# Patient Record
Sex: Female | Born: 1980 | Race: White | Hispanic: No | Marital: Married | Smoking: Never smoker
Health system: Southern US, Community
[De-identification: ages and names within clinical notes are randomized; demographics above are authoritative.]

## PROBLEM LIST (undated history)

## (undated) DIAGNOSIS — K5090 Crohn's disease, unspecified, without complications: Secondary | ICD-10-CM

## (undated) HISTORY — DX: Crohn's disease, unspecified, without complications: K50.90

---

## 2001-10-04 HISTORY — PX: BREAST ENHANCEMENT SURGERY: SHX7

## 2005-02-16 ENCOUNTER — Ambulatory Visit: Payer: Self-pay

## 2005-05-05 ENCOUNTER — Ambulatory Visit: Payer: Self-pay | Admitting: Gastroenterology

## 2005-09-26 IMAGING — US ABDOMEN ULTRASOUND
1 series · 17 of 25 positions shown · non-contrast
Comparison: none

REASON FOR EXAM: LUQ pain
COMMENTS:

[Series 1: abdomen ultrasound · 17 of 53 slices shown]
[im 1/53]
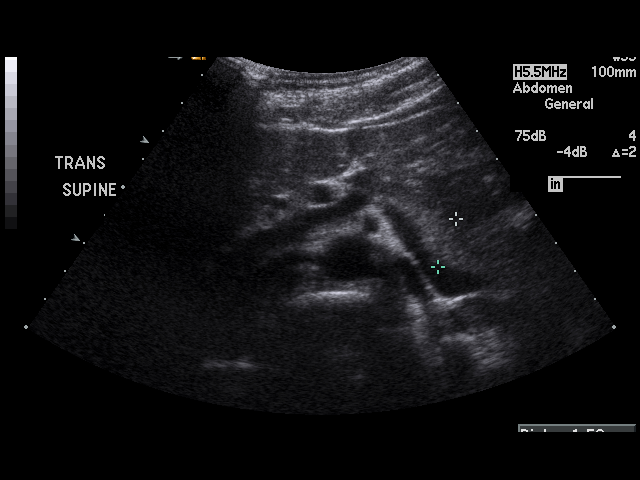
[im 5/53]
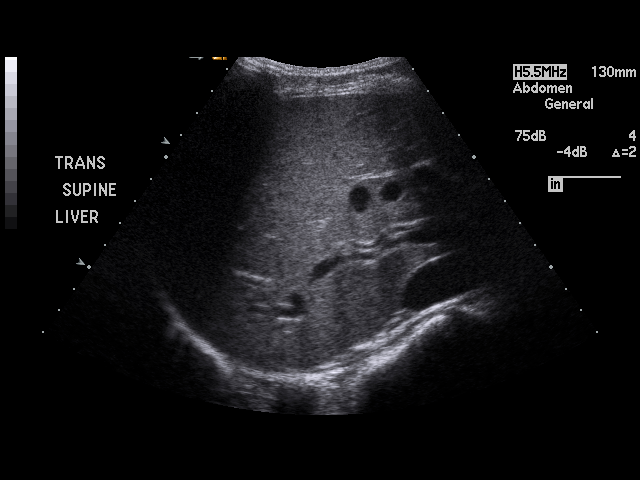
[im 7/53]
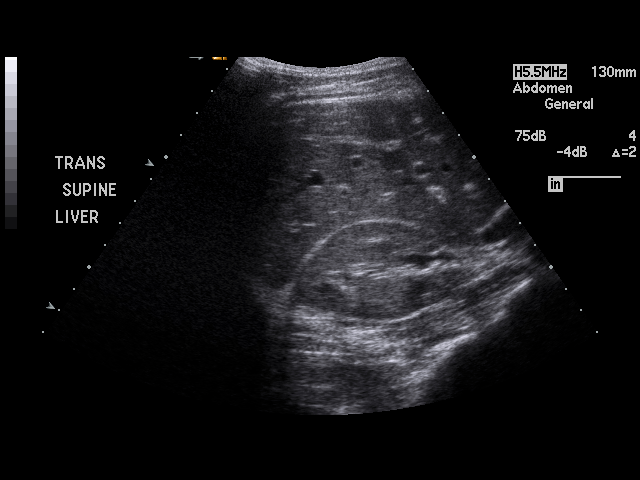
[im 11/53]
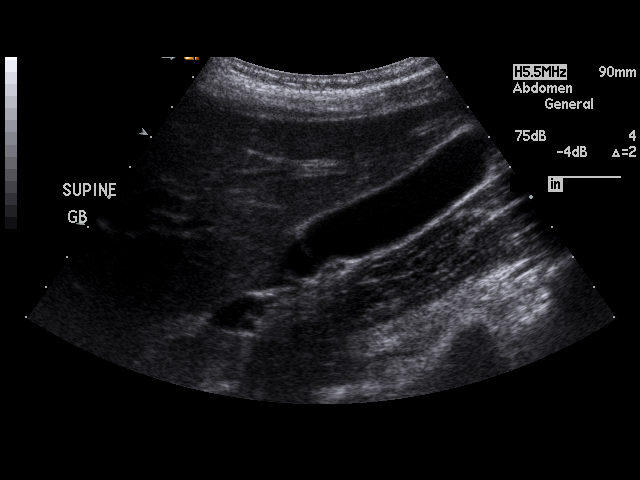
[im 14/53]
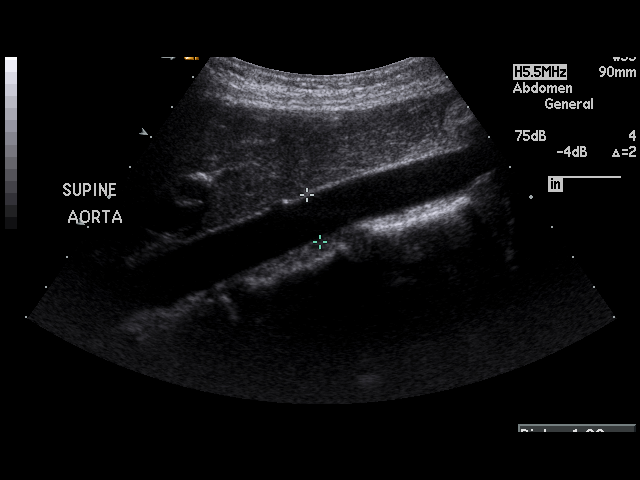
[im 18/53]
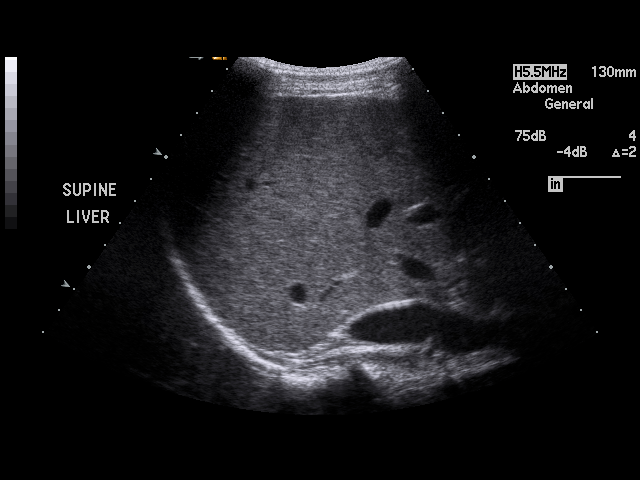
[im 20/53]
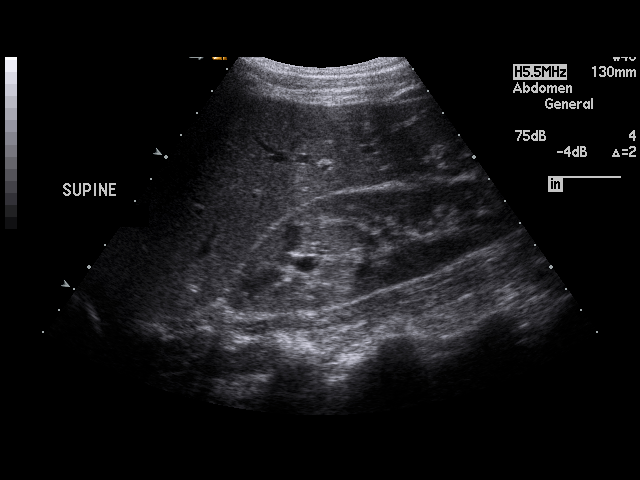
[im 24/53]
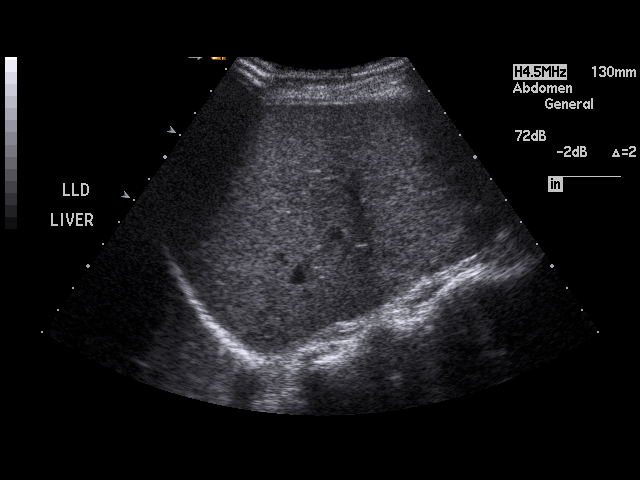
[im 27/53]
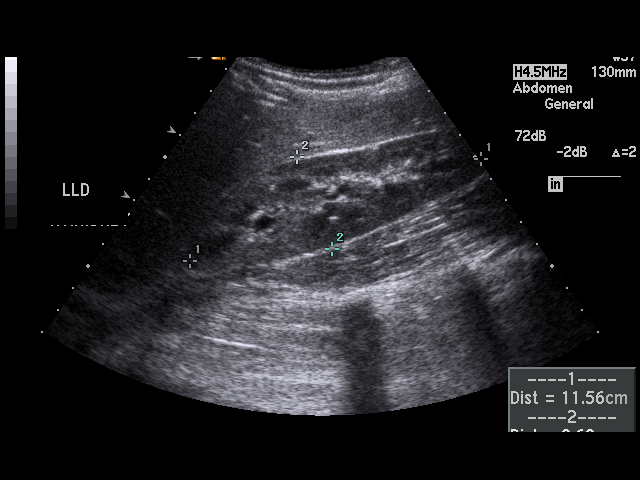
[im 29/53]
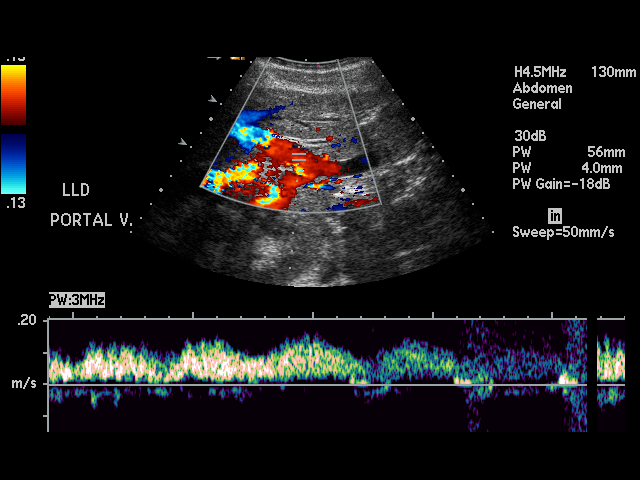
[im 33/53]
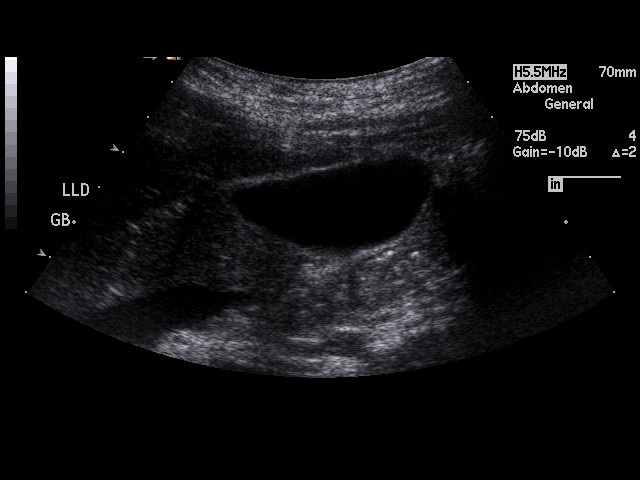
[im 35/53]
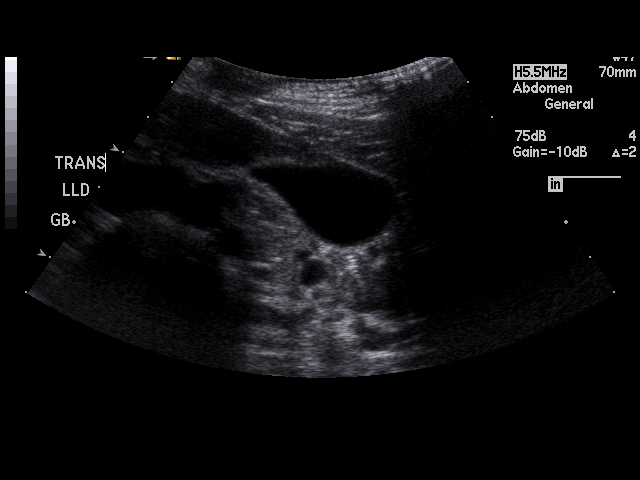
[im 40/53]
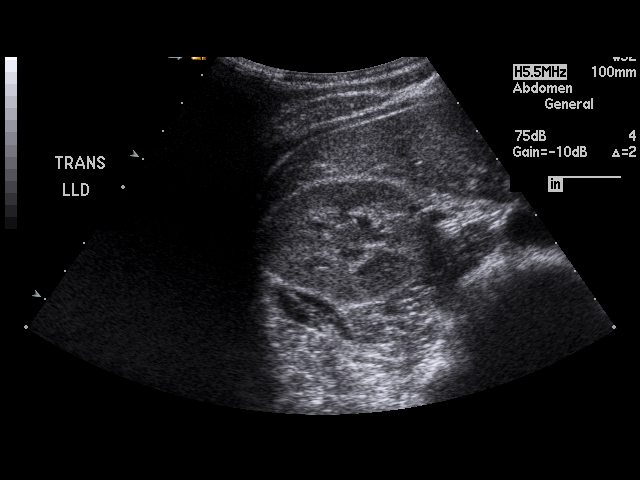
[im 42/53]
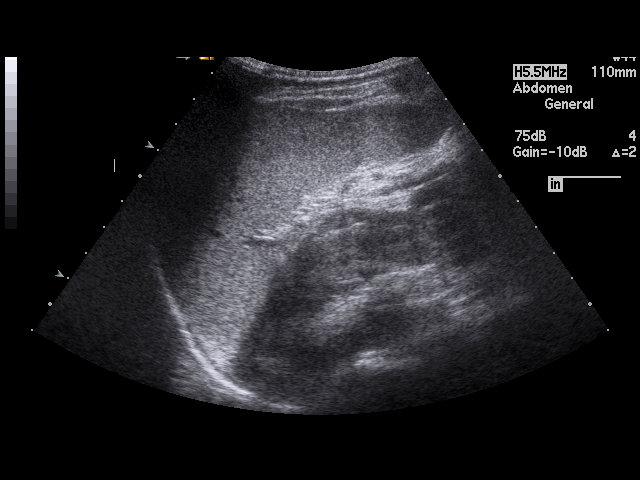
[im 46/53]
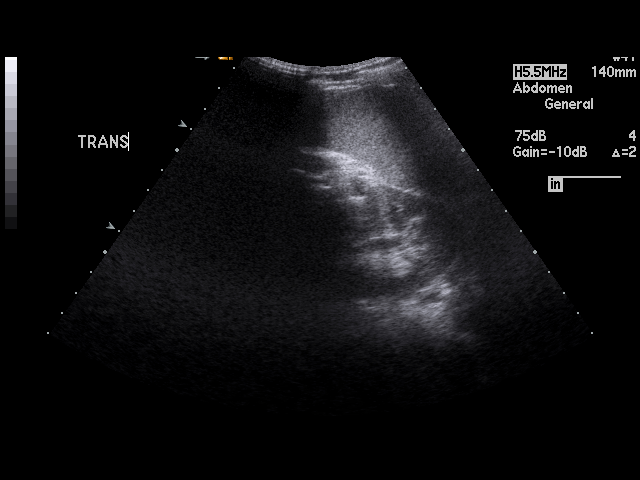
[im 48/53]
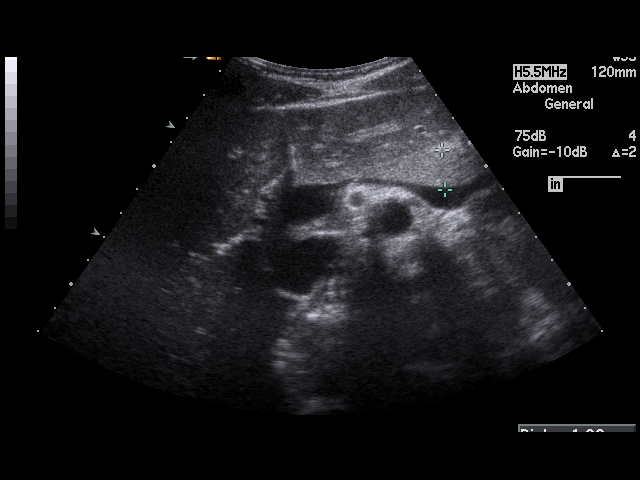
[im 53/53]
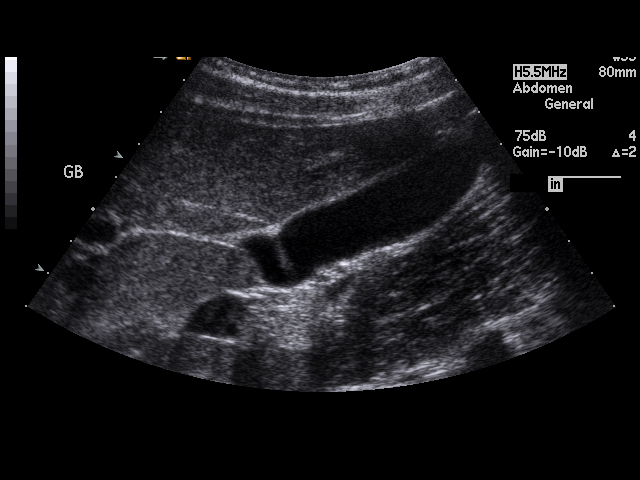

[17 of 25 positions shown; findings below may reference images not displayed]

PROCEDURE:     US  - US ABDOMEN GENERAL SURVEY  - February 16, 2005  [DATE]

RESULT:     Sonographic evaluation of the abdomen is performed.  The
pancreas, gallbladder, spleen, and kidneys appear normal.  The hepatic
echotexture appears to be normal.  No focal masses are seen.  No gallstones
or renal stones are evident.  There is no pericholecystic fluid.  The common
bile duct diameter is 2.3 mm.
IMPRESSION: Normal-appearing abdominal sonogram.

## 2007-06-22 ENCOUNTER — Inpatient Hospital Stay: Payer: Self-pay | Admitting: Certified Nurse Midwife

## 2008-03-23 ENCOUNTER — Emergency Department: Payer: Self-pay | Admitting: Emergency Medicine

## 2008-03-27 ENCOUNTER — Ambulatory Visit: Payer: Self-pay | Admitting: Gastroenterology

## 2008-10-31 IMAGING — US ABDOMEN ULTRASOUND
1 series · 17 of 25 positions shown · non-contrast
Comparison: none

REASON FOR EXAM: epigastric pain - eval gall bladder
COMMENTS:

[Series 1: abdomen ultrasound · 17 of 60 slices shown]
[im 1/60]
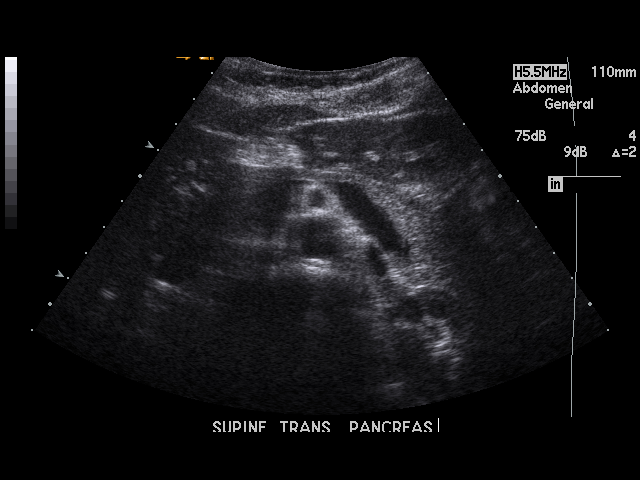
[im 5/60]
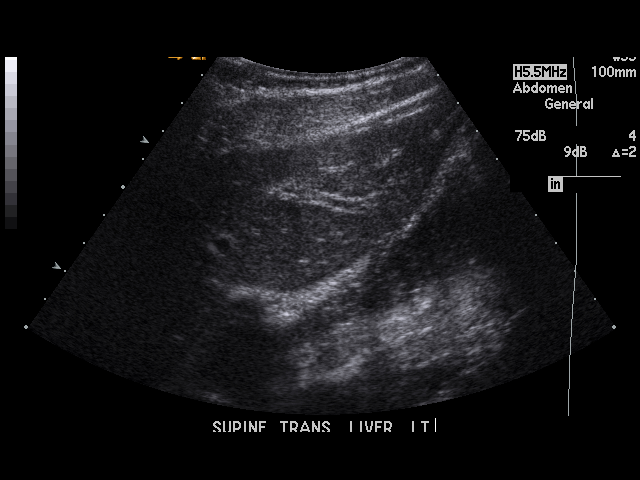
[im 8/60]
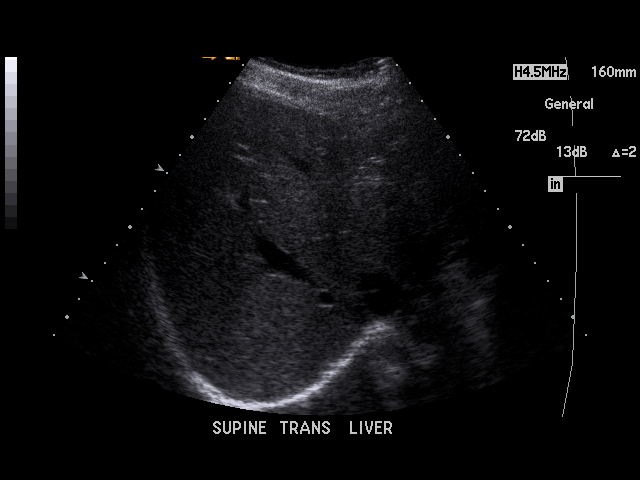
[im 13/60]
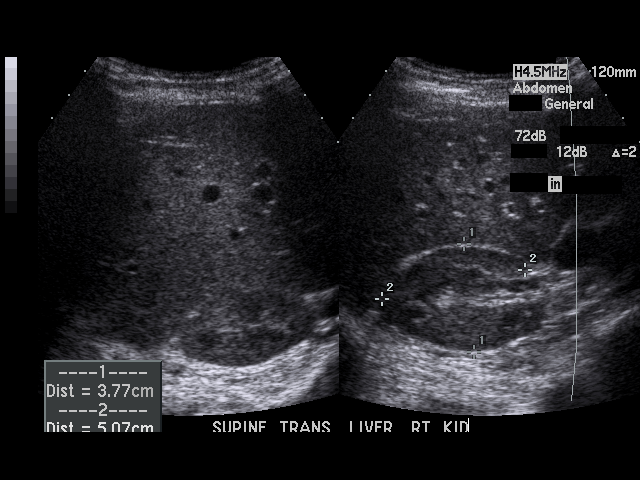
[im 15/60]
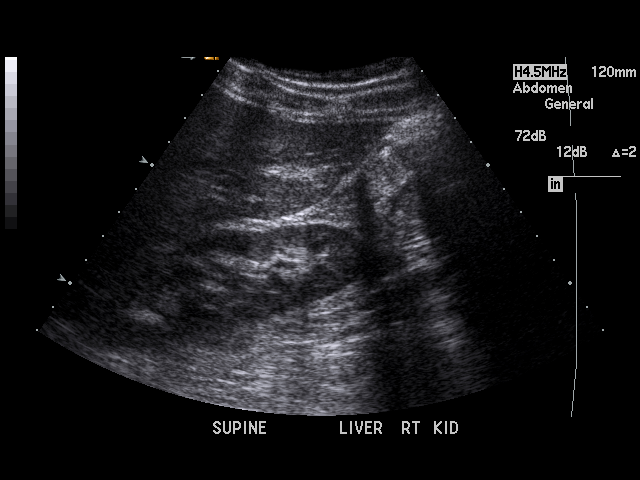
[im 20/60]
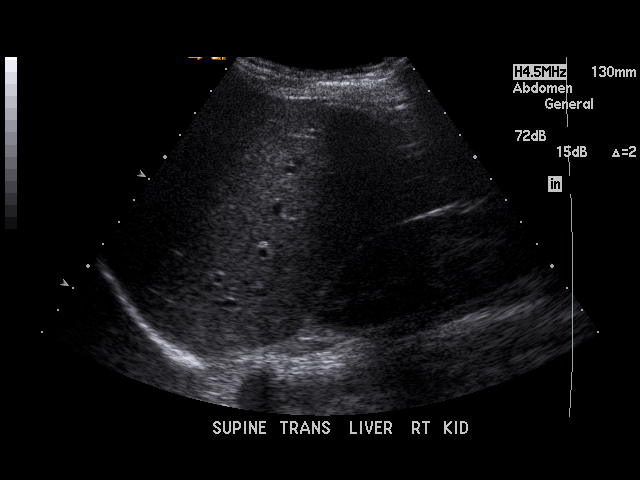
[im 23/60]
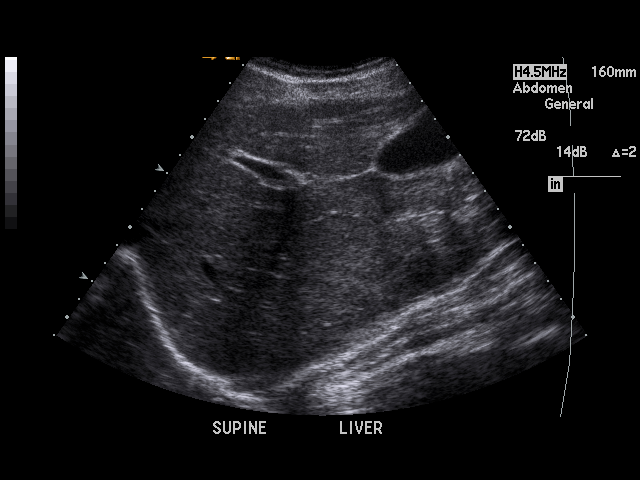
[im 28/60]
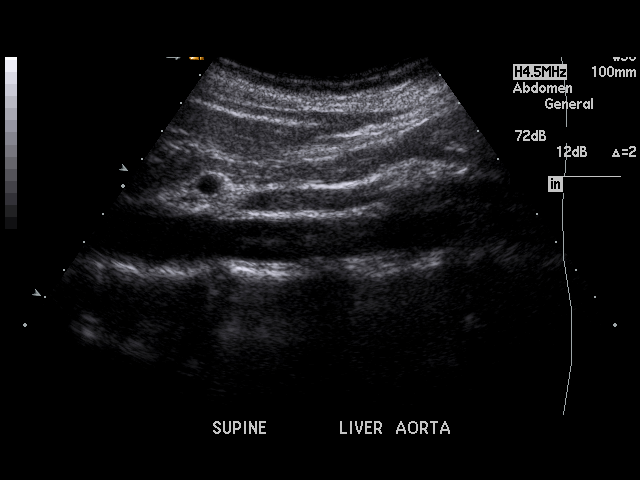
[im 30/60]
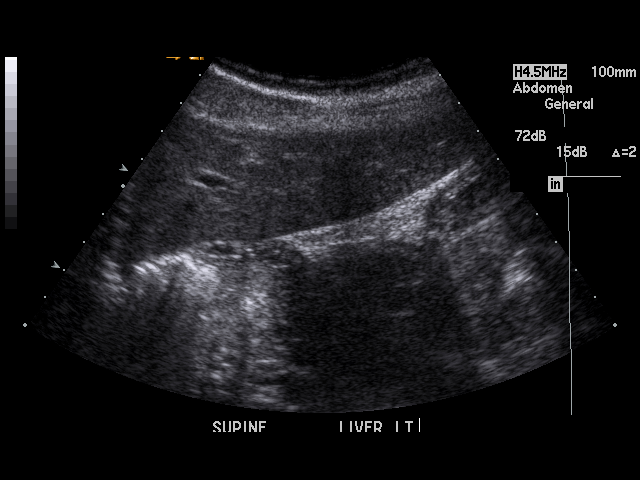
[im 32/60]
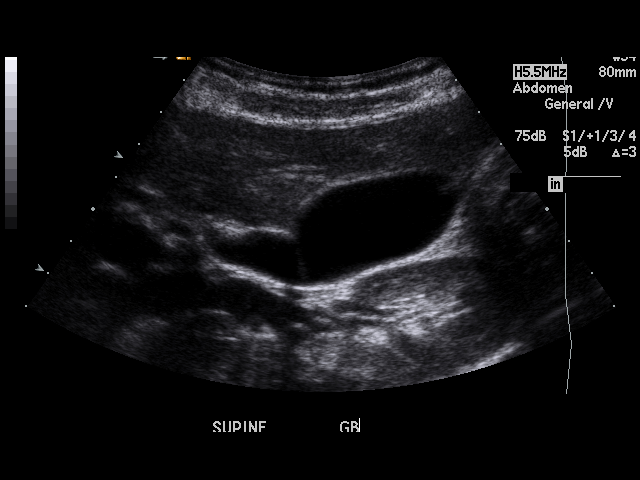
[im 37/60]
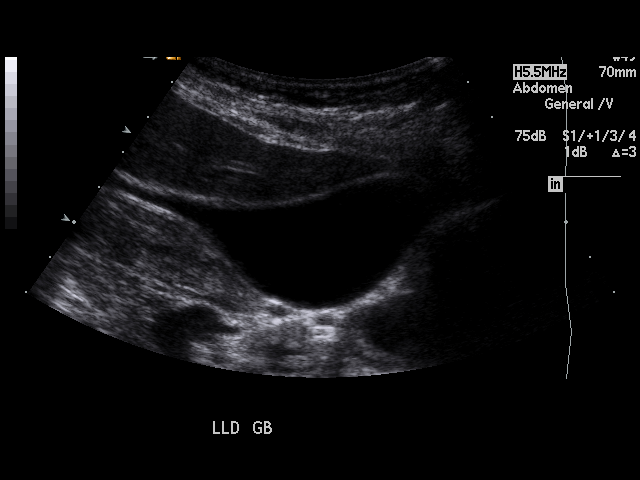
[im 40/60]
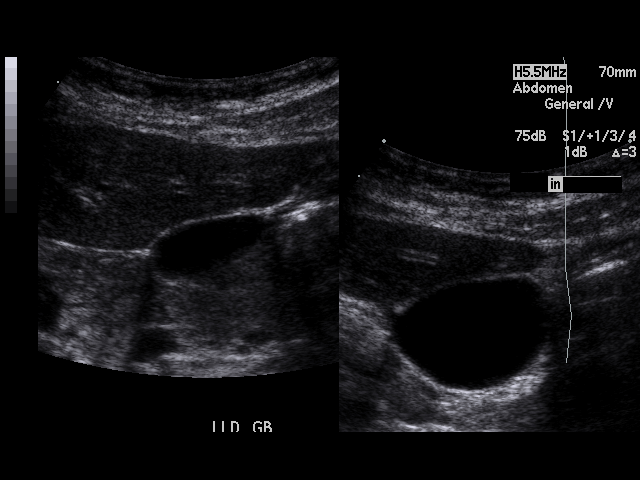
[im 45/60]
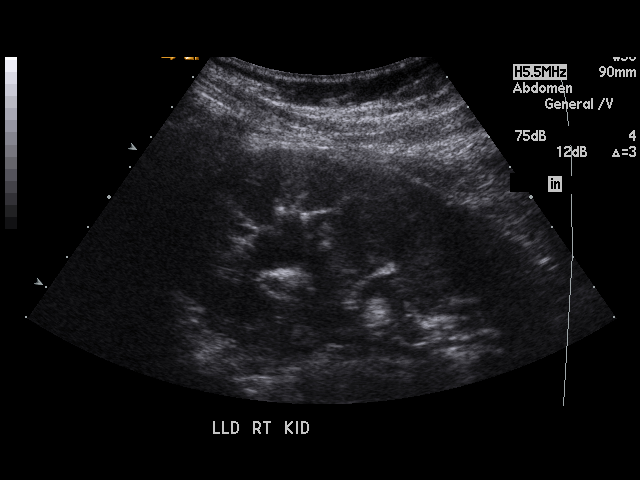
[im 47/60]
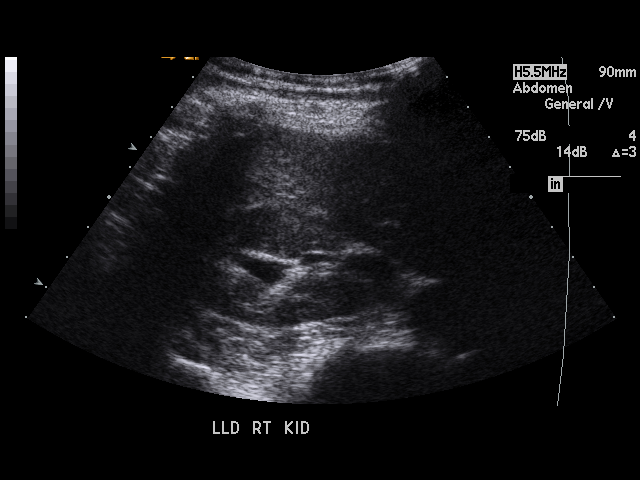
[im 52/60]
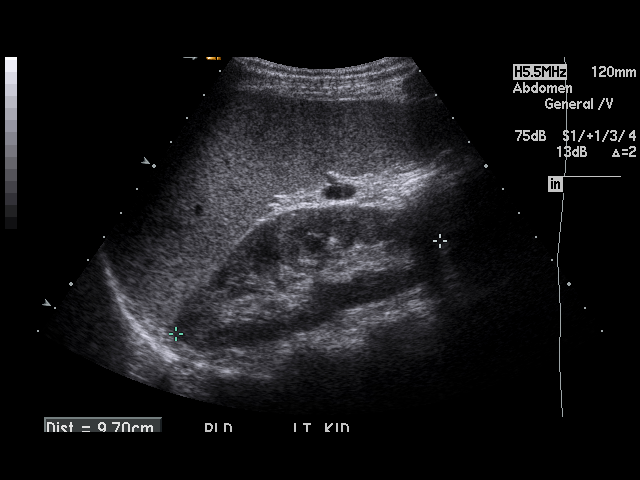
[im 55/60]
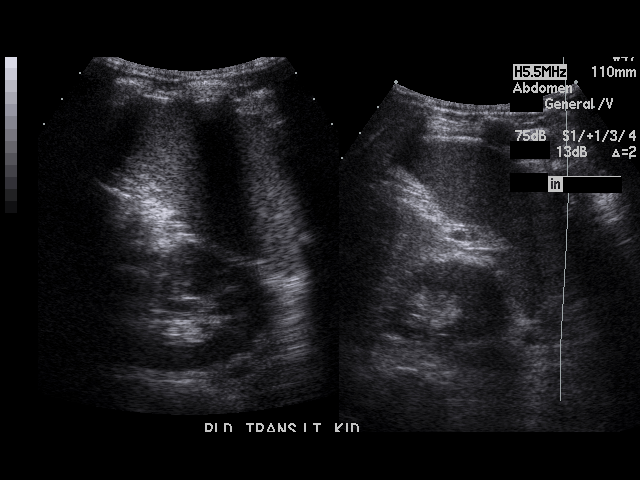
[im 60/60]
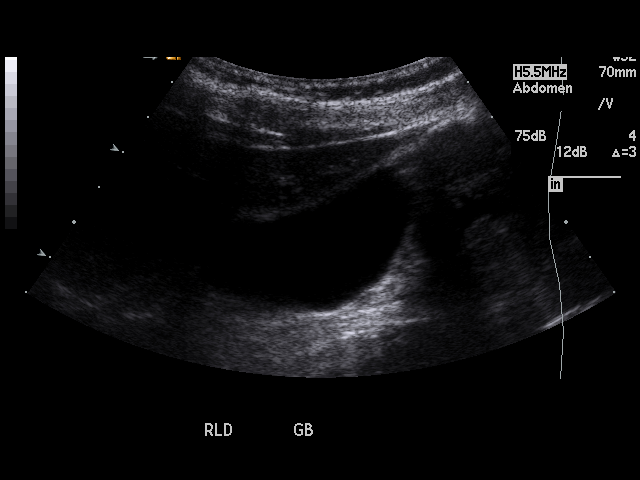

[17 of 25 positions shown; findings below may reference images not displayed]

PROCEDURE:     US  - US ABDOMEN GENERAL SURVEY  - March 23, 2008  [DATE]

RESULT:     The liver demonstrates a homogenous echotexture.  The common
bile duct measures 2.9 mm in diameter. Hepatopetal flow is demonstrated
within the portal vein. The IVC and aorta are unremarkable.  Evaluation of
the gallbladder demonstrates no evidence of pericholecystic fluid,
gallstones, sludging or gallbladder wall thickening.  The gallbladder wall
measures 1.2 mm in thickness.  The common bile duct measures 2.9 mm in
diameter.  Evaluation of the kidneys demonstrates no evidence of
hydronephrosis.  Small echogenic foci project within the RIGHT kidney which
may represent small nonobstructive calculi.  The spleen and pancreas are
unremarkable.
IMPRESSION: 1.     No sonographic evidence of acute cholecystitis.
2.     Dr. Wirti of the Emergency Department was informed of these
findings via preliminary fax report on 03/23/08 at [DATE] a.m. CST.

## 2009-08-23 ENCOUNTER — Ambulatory Visit: Payer: Self-pay | Admitting: Gastroenterology

## 2015-02-22 ENCOUNTER — Other Ambulatory Visit: Payer: Self-pay | Admitting: Obstetrics and Gynecology

## 2015-02-22 ENCOUNTER — Other Ambulatory Visit (HOSPITAL_COMMUNITY)
Admission: RE | Admit: 2015-02-22 | Discharge: 2015-02-22 | Disposition: A | Discharge status: Home / Self Care | Payer: Federal, State, Local not specified - PPO | Source: Ambulatory Visit | Attending: Obstetrics and Gynecology | Admitting: Obstetrics and Gynecology

## 2015-02-22 DIAGNOSIS — Z01411 Encounter for gynecological examination (general) (routine) with abnormal findings: Secondary | ICD-10-CM | POA: Diagnosis present

## 2015-02-22 DIAGNOSIS — Z1151 Encounter for screening for human papillomavirus (HPV): Secondary | ICD-10-CM | POA: Diagnosis present

## 2015-02-24 LAB — CYTOLOGY - PAP

## 2015-08-31 LAB — OB RESULTS CONSOLE ABO/RH: RH Type: NEGATIVE

## 2015-08-31 LAB — OB RESULTS CONSOLE RUBELLA ANTIBODY, IGM: Rubella: IMMUNE

## 2015-08-31 LAB — OB RESULTS CONSOLE RPR: RPR: NONREACTIVE

## 2015-08-31 LAB — OB RESULTS CONSOLE HEPATITIS B SURFACE ANTIGEN: Hepatitis B Surface Ag: NEGATIVE

## 2015-10-03 NOTE — L&D Delivery Note (Signed)
Delivery Note At 1:36 PM a viable female was delivered by SVD (Presentation: Right Occiput Anterior).  APGAR: 9, 9; weight 8 lb 2 oz (2339 g).   Placenta status: Intact, Spontaneous.  Cord: 3 vessels with the following complications: None.  Cord pH: n/a Cord blood donated for banking.  Collected by technician.  Anesthesia: Epidural  Episiotomy: None Lacerations: Right labial laceration, 2nd degree laceration Suture Repair: 2.0,3.0 Chromic Est. Blood Loss (mL): 250  Mom to postpartum.  Baby to Couplet care / Skin to Skin     Hala Narula 04/25/2016, 2:14 PM

## 2015-11-19 ENCOUNTER — Telehealth: Payer: Self-pay | Admitting: Internal Medicine

## 2015-11-19 NOTE — Telephone Encounter (Signed)
Received records from [facility] for appointment on 12/03/15 with Dr Rennis Golden.  Records given to [facility] (medical records) for Dr Blanchie Dessert schedule on 12/03/15. lp

## 2015-12-03 ENCOUNTER — Encounter: Payer: Self-pay | Admitting: Internal Medicine

## 2015-12-03 ENCOUNTER — Ambulatory Visit (INDEPENDENT_AMBULATORY_CARE_PROVIDER_SITE_OTHER): Payer: Federal, State, Local not specified - PPO | Admitting: Internal Medicine

## 2015-12-03 VITALS — BP 104/60 | HR 78 | Ht 67.0 in | Wt 148.1 lb

## 2015-12-03 DIAGNOSIS — I4510 Unspecified right bundle-branch block: Secondary | ICD-10-CM

## 2015-12-03 DIAGNOSIS — Z3490 Encounter for supervision of normal pregnancy, unspecified, unspecified trimester: Secondary | ICD-10-CM

## 2015-12-03 NOTE — Patient Instructions (Signed)
Your physician recommends that you schedule a follow-up appointment as needed  

## 2015-12-05 ENCOUNTER — Encounter: Payer: Self-pay | Admitting: Internal Medicine

## 2015-12-05 DIAGNOSIS — I4510 Unspecified right bundle-branch block: Secondary | ICD-10-CM | POA: Insufficient documentation

## 2015-12-05 DIAGNOSIS — Z3490 Encounter for supervision of normal pregnancy, unspecified, unspecified trimester: Secondary | ICD-10-CM | POA: Insufficient documentation

## 2015-12-05 NOTE — Progress Notes (Signed)
OFFICE NOTE  Chief Complaint:  Abnormal EKG  Primary Care Physician: No primary care provider on file.  HPI:  Amber Woodard is a pleasant 35 year old female who is currently pregnant with her second child. Her first child is now 35 years old. She recently had an EKG which demonstrated a right bundle branch block. She was referred for evaluation of that abnormality. We repeated an EKG today in the office which does not demonstrate a right bundle branch block. This suggests it may be rate dependent or intermittent. Amber Woodard has few if any cardiac risk factors. She is generally healthy except she does have a history of Crohn's disease. This sounds like it's been well controlled she does not have diabetes or dyslipidemia. She denies any recent shortness of breath worsening chest pain, syncope or other associated symptoms.  PMHx:  Past Medical History  Diagnosis Date  . Crohn disease [facility])     Past Surgical History  Procedure Laterality Date  . Breast enhancement surgery      FAMHx:  Family History  Problem Relation Age of Onset  . Alzheimer's disease Maternal Grandfather   . Alzheimer's disease Paternal Grandfather     SOCHx:   reports that she has never smoked. She does not have any smokeless tobacco history on file. She reports that she does not drink alcohol or use illicit drugs.  ALLERGIES:  Allergies  Allergen Reactions  . Codeine Nausea And Vomiting    ROS: Pertinent items noted in HPI and remainder of comprehensive ROS otherwise negative.  HOME MEDS: Current Outpatient Prescriptions  Medication Sig Dispense Refill  . Prenatal Vit-Fe Fumarate-FA (PRENATAL VITAMIN PO) Take 2 Doses by mouth daily.     No current facility-administered medications for this visit.    LABS/IMAGING: No results found for this or any previous visit (from the past 48 hour(s)). No results found.  WEIGHTS: Wt Readings from Last 3 Encounters:  12/03/15 148 lb 1.6 oz (67.178 kg)      VITALS: BP 104/60 mmHg  Pulse 78  Ht 5\' 7"  (1.702 m)  Wt 148 lb 1.6 oz (67.178 kg)  BMI 23.19 kg/m2  EXAM: General appearance: alert and no distress Neck: no carotid bruit and no JVD Lungs: clear to auscultation bilaterally Heart: regular rate and rhythm, S1, S2 normal, no murmur, click, rub or gallop Abdomen: soft, non-tender; bowel sounds normal; no masses,  no organomegaly Extremities: extremities normal, atraumatic, no cyanosis or edema Pulses: 2+ and symmetric Skin: Skin color, texture, turgor normal. No rashes or lesions Neurologic: Grossly normal Psych: Pleasant  EKG: Normal sinus rhythm at 76  ASSESSMENT: 1. Intermittent right bundle branch block 2. Pregnancy with history of uncomplicated pregnancy in the past 3. Crohn's colitis  PLAN: 1.   Mrs. Parsell had intermittent right bundle branch block. Unfortunately I'm not able to review the EKG from her primary care provider's office directly. Are EKG today however does not show right bundle branch block. This could suggest that the block could be rate dependent or based on other conditions. This is even less likely to be of any concern. She is completely asymptomatic. She's had a previous pregnancy without any complications. I do not for see the need for any additional testing and would not categorize any overt EKG changes at putting her at any increased risk for her pregnancy. She does have Crohn's disease and there may be an increased risk for coronary artery disease related to this disorder. She should continue active lifestyle, risk  factor modification and treatment to suppress her Crohn's disease as she has in the past.  Thanks for the kind referral. Follow-up as needed.  Kenneth C. Hilty, [facility] Attending Cardiologist [facility]  Chrystie Nose 12/05/2015, 11:33 AM

## 2015-12-10 ENCOUNTER — Encounter: Payer: Self-pay | Admitting: *Deleted

## 2016-01-27 LAB — OB RESULTS CONSOLE ANTIBODY SCREEN: Antibody Screen: NEGATIVE

## 2016-01-27 LAB — OB RESULTS CONSOLE HIV ANTIBODY (ROUTINE TESTING): HIV: NONREACTIVE

## 2016-03-23 LAB — OB RESULTS CONSOLE GBS: GBS: NEGATIVE

## 2016-04-01 ENCOUNTER — Inpatient Hospital Stay (HOSPITAL_COMMUNITY)
Admission: AD | Admit: 2016-04-01 | Payer: Federal, State, Local not specified - PPO | Source: Ambulatory Visit | Admitting: Obstetrics and Gynecology

## 2016-04-19 ENCOUNTER — Telehealth (HOSPITAL_COMMUNITY): Payer: Self-pay | Admitting: *Deleted

## 2016-04-19 NOTE — Telephone Encounter (Signed)
Preadmission screen  

## 2016-04-20 ENCOUNTER — Encounter (HOSPITAL_COMMUNITY): Payer: Self-pay | Admitting: *Deleted

## 2016-04-20 ENCOUNTER — Telehealth (HOSPITAL_COMMUNITY): Payer: Self-pay | Admitting: *Deleted

## 2016-04-20 NOTE — Telephone Encounter (Signed)
Preadmission screen  

## 2016-04-25 NOTE — H&P (Signed)
Amber Woodard is a 35 y.o. female G2 P1001 @ 40 6/7 weeks presenting for IOL due to postdates.  Pt's pregnancy has been complicated by AMA and personal h/o Crohn's disease.  Panoroma and AFP were low risk, Crohn's Disease has been well controlled. Pt denies regular contractions.  Some pink spotting, denies LOF.  Active fetus. PNC with [facility]) since 10 weeks.  OB History    Gravida Para Term Preterm AB Living   SAB TAB Ectopic Multiple Live Births           1     Past Medical History:  Diagnosis Date  . Crohn disease [facility])    Past Surgical History:  Procedure Laterality Date  . BREAST ENHANCEMENT SURGERY  10/04/2001   Family History: family history includes Alzheimer's disease in her maternal grandfather and paternal grandfather. Social History:  reports that she has never smoked. She has never used smokeless tobacco. She reports that she does not drink alcohol or use drugs.     Maternal Diabetes: No Genetic Screening: Normal Maternal Ultrasounds/Referrals: Normal Fetal Ultrasounds or other Referrals:  None Maternal Substance Abuse:  No Significant Maternal Medications:  Meds include: Other: Pentasa stopped in October Significant Maternal Lab Results:  Lab values include: Group B Strep negative, Rh negative Other Comments:  None  Review of Systems  Constitutional: Negative.   Gastrointestinal: Negative for abdominal pain.   Maternal Medical History:  Contractions: Frequency: irregular.   Perceived severity is mild.    Fetal activity: Perceived fetal activity is normal.    Prenatal complications: no prenatal complications Prenatal Complications - Diabetes: none.      Last menstrual period 07/14/2015. Maternal Exam:  Abdomen: Patient reports no abdominal tenderness. Fundal height is 40 cm.   Estimated fetal weight is 7 1/2.   Fetal presentation: vertex  Introitus: Normal vulva. Ferning test: not done.   Pelvis: adequate for delivery.    Cervix: Cervix evaluated by digital exam.   1/50/-3  Fetal Exam Fetal Monitor Review: Mode: fetoscope and hand-held doppler probe.   Baseline rate: 150s.      Physical Exam  Constitutional: She is oriented to person, place, and time. She appears well-developed and well-nourished.  HENT:  Head: Normocephalic and atraumatic.  Neck: Normal range of motion.  Respiratory: Effort normal. No respiratory distress.  GI: There is no tenderness.  Musculoskeletal: She exhibits edema. She exhibits no tenderness.  Neurological: She is alert and oriented to person, place, and time.  Skin: Skin is warm and dry.  Psychiatric: She has a normal mood and affect.    Prenatal labs: ABO, Rh: A/Negative/-- (11/29 0000) Antibody: Negative (04/27 0000) Rubella: Immune (11/29 0000) RPR: Nonreactive (11/29 0000)  HBsAg: Negative (11/29 0000)  HIV: Non-reactive (04/27 0000)  GBS: Positive (06/22 0000)   Assessment/Plan: IUP @ 40 6/7 weeks AMA Rh negative H/o Crohn's disease-stable GBS negative (not positive as above)  Admit for IOL with Cytotec then Pitocin. Epidural, IV Fentanyl or Nitrous prn.  Pt counseled on process and risk.  Geryl Rankins 04/25/2016, 5:40 PM

## 2016-04-26 ENCOUNTER — Inpatient Hospital Stay (HOSPITAL_COMMUNITY)
Admission: RE | Admit: 2016-04-26 | Discharge: 2016-04-27 | DRG: 775 | Disposition: A | Discharge status: Home / Self Care | Payer: Federal, State, Local not specified - PPO | Source: Ambulatory Visit | Attending: Obstetrics and Gynecology | Admitting: Obstetrics and Gynecology

## 2016-04-26 ENCOUNTER — Inpatient Hospital Stay (HOSPITAL_COMMUNITY): Payer: Federal, State, Local not specified - PPO | Admitting: Anesthesiology

## 2016-04-26 ENCOUNTER — Encounter (HOSPITAL_COMMUNITY): Payer: Self-pay

## 2016-04-26 DIAGNOSIS — O26893 Other specified pregnancy related conditions, third trimester: Secondary | ICD-10-CM | POA: Diagnosis present

## 2016-04-26 DIAGNOSIS — Z820 Family history of epilepsy and other diseases of the nervous system: Secondary | ICD-10-CM

## 2016-04-26 DIAGNOSIS — Z6791 Unspecified blood type, Rh negative: Secondary | ICD-10-CM | POA: Diagnosis not present

## 2016-04-26 DIAGNOSIS — O480 Post-term pregnancy: Secondary | ICD-10-CM | POA: Diagnosis present

## 2016-04-26 DIAGNOSIS — O99824 Streptococcus B carrier state complicating childbirth: Secondary | ICD-10-CM | POA: Diagnosis present

## 2016-04-26 DIAGNOSIS — K5090 Crohn's disease, unspecified, without complications: Secondary | ICD-10-CM | POA: Diagnosis present

## 2016-04-26 DIAGNOSIS — O9962 Diseases of the digestive system complicating childbirth: Secondary | ICD-10-CM | POA: Diagnosis present

## 2016-04-26 DIAGNOSIS — Z3A41 41 weeks gestation of pregnancy: Secondary | ICD-10-CM | POA: Diagnosis not present

## 2016-04-26 LAB — CBC
HCT: 31.5 % — ABNORMAL LOW (ref 36.0–46.0)
Hemoglobin: 10.2 g/dL — ABNORMAL LOW (ref 12.0–15.0)
MCH: 24.3 pg — AB (ref 26.0–34.0)
MCHC: 32.4 g/dL (ref 30.0–36.0)
MCV: 75 fL — ABNORMAL LOW (ref 78.0–100.0)
PLATELETS: 215 10*3/uL (ref 150–400)
RBC: 4.2 MIL/uL (ref 3.87–5.11)
RDW: 14.1 % (ref 11.5–15.5)
WBC: 11.5 10*3/uL — ABNORMAL HIGH (ref 4.0–10.5)

## 2016-04-26 LAB — ABO/RH: ABO/RH(D): A NEG

## 2016-04-26 LAB — TYPE AND SCREEN
ABO/RH(D): A NEG
Antibody Screen: NEGATIVE

## 2016-04-26 LAB — RPR: RPR: NONREACTIVE

## 2016-04-26 MED ORDER — FENTANYL 2.5 MCG/ML BUPIVACAINE 1/10 % EPIDURAL INFUSION (WH - ANES)
14.0000 mL/h | INTRAMUSCULAR | Status: DC | PRN
  Administered 2016-04-26: 14 mL/h via EPIDURAL
  Filled 2016-04-26: qty 125

## 2016-04-26 MED ORDER — LACTATED RINGERS IV SOLN
INTRAVENOUS | Status: DC
  Administered 2016-04-26: 02:00:00 via INTRAVENOUS

## 2016-04-26 MED ORDER — OXYTOCIN 40 UNITS IN LACTATED RINGERS INFUSION - SIMPLE MED
2.5000 [IU]/h | INTRAVENOUS | Status: DC
  Administered 2016-04-26: 2.5 [IU]/h via INTRAVENOUS

## 2016-04-26 MED ORDER — PHENYLEPHRINE 40 MCG/ML (10ML) SYRINGE FOR IV PUSH (FOR BLOOD PRESSURE SUPPORT)
80.0000 ug | PREFILLED_SYRINGE | INTRAVENOUS | Status: DC | PRN
  Filled 2016-04-26: qty 5

## 2016-04-26 MED ORDER — COCONUT OIL OIL: 1.0000 "application " | TOPICAL_OIL | Status: DC | PRN

## 2016-04-26 MED ORDER — METHYLERGONOVINE MALEATE 0.2 MG PO TABS: 0.2000 mg | ORAL_TABLET | ORAL | Status: DC | PRN

## 2016-04-26 MED ORDER — DIPHENHYDRAMINE HCL 50 MG/ML IJ SOLN: 12.5000 mg | INTRAMUSCULAR | Status: DC | PRN

## 2016-04-26 MED ORDER — LIDOCAINE HCL (PF) 1 % IJ SOLN
30.0000 mL | INTRAMUSCULAR | Status: DC | PRN
  Filled 2016-04-26: qty 30

## 2016-04-26 MED ORDER — ONDANSETRON HCL 4 MG/2ML IJ SOLN: 4.0000 mg | Freq: Four times a day (QID) | INTRAMUSCULAR | Status: DC | PRN

## 2016-04-26 MED ORDER — DIPHENHYDRAMINE HCL 25 MG PO CAPS: 25.0000 mg | ORAL_CAPSULE | Freq: Four times a day (QID) | ORAL | Status: DC | PRN

## 2016-04-26 MED ORDER — IBUPROFEN 600 MG PO TABS
600.0000 mg | ORAL_TABLET | Freq: Four times a day (QID) | ORAL | Status: DC
  Administered 2016-04-26 – 2016-04-27 (×4): 600 mg via ORAL
  Filled 2016-04-26 (×4): qty 1

## 2016-04-26 MED ORDER — OXYCODONE-ACETAMINOPHEN 5-325 MG PO TABS: 2.0000 | ORAL_TABLET | ORAL | Status: DC | PRN

## 2016-04-26 MED ORDER — TETANUS-DIPHTH-ACELL PERTUSSIS 5-2.5-18.5 LF-MCG/0.5 IM SUSP: 0.5000 mL | Freq: Once | INTRAMUSCULAR | Status: DC

## 2016-04-26 MED ORDER — ONDANSETRON HCL 4 MG/2ML IJ SOLN
4.0000 mg | INTRAMUSCULAR | Status: DC | PRN
Start: 2016-04-26 — End: 2016-04-27

## 2016-04-26 MED ORDER — OXYCODONE-ACETAMINOPHEN 5-325 MG PO TABS: 1.0000 | ORAL_TABLET | ORAL | Status: DC | PRN

## 2016-04-26 MED ORDER — METHYLERGONOVINE MALEATE 0.2 MG/ML IJ SOLN: 0.2000 mg | INTRAMUSCULAR | Status: DC | PRN

## 2016-04-26 MED ORDER — FENTANYL CITRATE (PF) 100 MCG/2ML IJ SOLN: 50.0000 ug | INTRAMUSCULAR | Status: DC | PRN

## 2016-04-26 MED ORDER — TERBUTALINE SULFATE 1 MG/ML IJ SOLN
0.2500 mg | Freq: Once | INTRAMUSCULAR | Status: DC | PRN
  Filled 2016-04-26: qty 1

## 2016-04-26 MED ORDER — LIDOCAINE HCL (PF) 1 % IJ SOLN
INTRAMUSCULAR | Status: DC | PRN
  Administered 2016-04-26: 2 mL via EPIDURAL
  Administered 2016-04-26 (×2): 4 mL via EPIDURAL

## 2016-04-26 MED ORDER — PRENATAL MULTIVITAMIN CH
1.0000 | ORAL_TABLET | Freq: Every day | ORAL | Status: DC
  Filled 2016-04-26: qty 1

## 2016-04-26 MED ORDER — MAGNESIUM HYDROXIDE 400 MG/5ML PO SUSP: 30.0000 mL | ORAL | Status: DC | PRN

## 2016-04-26 MED ORDER — EPHEDRINE 5 MG/ML INJ
10.0000 mg | INTRAVENOUS | Status: DC | PRN
  Filled 2016-04-26: qty 4

## 2016-04-26 MED ORDER — ZOLPIDEM TARTRATE 5 MG PO TABS: 5.0000 mg | ORAL_TABLET | Freq: Every evening | ORAL | Status: DC | PRN

## 2016-04-26 MED ORDER — PHENYLEPHRINE 40 MCG/ML (10ML) SYRINGE FOR IV PUSH (FOR BLOOD PRESSURE SUPPORT)
80.0000 ug | PREFILLED_SYRINGE | INTRAVENOUS | Status: DC | PRN
  Filled 2016-04-26: qty 5
  Filled 2016-04-26: qty 10

## 2016-04-26 MED ORDER — SENNOSIDES-DOCUSATE SODIUM 8.6-50 MG PO TABS
2.0000 | ORAL_TABLET | ORAL | Status: DC
  Administered 2016-04-27: 2 via ORAL
  Filled 2016-04-26: qty 2

## 2016-04-26 MED ORDER — LACTATED RINGERS IV SOLN
500.0000 mL | Freq: Once | INTRAVENOUS | Status: AC
  Administered 2016-04-26: 500 mL via INTRAVENOUS

## 2016-04-26 MED ORDER — LACTATED RINGERS IV SOLN: 500.0000 mL | INTRAVENOUS | Status: DC | PRN

## 2016-04-26 MED ORDER — OXYTOCIN 40 UNITS IN LACTATED RINGERS INFUSION - SIMPLE MED: 2.5000 [IU]/h | INTRAVENOUS | Status: DC | PRN

## 2016-04-26 MED ORDER — SIMETHICONE 80 MG PO CHEW: 80.0000 mg | CHEWABLE_TABLET | ORAL | Status: DC | PRN

## 2016-04-26 MED ORDER — BENZOCAINE-MENTHOL 20-0.5 % EX AERO
1.0000 "application " | INHALATION_SPRAY | CUTANEOUS | Status: DC | PRN
  Administered 2016-04-26: 1 via TOPICAL
  Filled 2016-04-26: qty 56

## 2016-04-26 MED ORDER — WITCH HAZEL-GLYCERIN EX PADS: 1.0000 "application " | MEDICATED_PAD | CUTANEOUS | Status: DC | PRN

## 2016-04-26 MED ORDER — OXYTOCIN BOLUS FROM INFUSION
500.0000 mL | Freq: Once | INTRAVENOUS | Status: AC
  Administered 2016-04-26: 500 mL via INTRAVENOUS

## 2016-04-26 MED ORDER — ACETAMINOPHEN 325 MG PO TABS: 650.0000 mg | ORAL_TABLET | ORAL | Status: DC | PRN

## 2016-04-26 MED ORDER — HYDROXYZINE HCL 50 MG PO TABS
50.0000 mg | ORAL_TABLET | Freq: Four times a day (QID) | ORAL | Status: DC | PRN
  Filled 2016-04-26: qty 1

## 2016-04-26 MED ORDER — OXYTOCIN 40 UNITS IN LACTATED RINGERS INFUSION - SIMPLE MED
1.0000 m[IU]/min | INTRAVENOUS | Status: DC
  Administered 2016-04-26: 2 m[IU]/min via INTRAVENOUS
  Filled 2016-04-26: qty 1000

## 2016-04-26 MED ORDER — MEASLES, MUMPS & RUBELLA VAC ~~LOC~~ INJ: 0.5000 mL | INJECTION | Freq: Once | SUBCUTANEOUS | Status: DC

## 2016-04-26 MED ORDER — DIBUCAINE 1 % RE OINT: 1.0000 "application " | TOPICAL_OINTMENT | RECTAL | Status: DC | PRN

## 2016-04-26 MED ORDER — MISOPROSTOL 25 MCG QUARTER TABLET
25.0000 ug | ORAL_TABLET | ORAL | Status: DC | PRN
  Administered 2016-04-26: 25 ug via VAGINAL
  Filled 2016-04-26: qty 0.25
  Filled 2016-04-26: qty 1

## 2016-04-26 MED ORDER — ONDANSETRON HCL 4 MG PO TABS: 4.0000 mg | ORAL_TABLET | ORAL | Status: DC | PRN

## 2016-04-26 MED ORDER — SOD CITRATE-CITRIC ACID 500-334 MG/5ML PO SOLN: 30.0000 mL | ORAL | Status: DC | PRN

## 2016-04-26 NOTE — Progress Notes (Signed)
  Informed by RN pt progressed to 7 cm then to 10 within 1 hour.  Early and variable decelerations noted x 1 hour when notified.  Pitocin discontinued.  RN reports frequent position changes.  Pt reports pressure but comfortable with epidural.  VSS  Gen:  NAD, comfortable. Pelvic:  Bloody show, Cervix C/C/ 0-+1, AROM light meconium noted.  EFM:  Baseline 140s-150s, variable and early decelerations +accels.  Prolonged deceleration with good variability throughout. Contractions q2-3 min  A/P  IUP @ 41 0/7 weeks Category II tracing but overall reassuring. Light meconium.  Anticipate NSVD.

## 2016-04-26 NOTE — Lactation Note (Signed)
This note was copied from a baby's chart. Lactation Consultation Note  Patient Name: Girl Lissete Maestas [id number] Date: 04/26/2016 Reason for consult: Initial assessment Baby at 8 hr of life. Experienced bf mom reports feedings are going well. She denies breast or nipple pain. Her breast augmention was 5 yr before her oldest child. She had mastitis when he was 45 month old but never had any other bf problems. Discussed baby behavior, feeding frequency, baby belly size, voids, wt loss, breast changes, and nipple care. She stated she can manually express and has spoon in room. Given lactation handouts. Aware of OP services and support group. She will call as needed.     Maternal Data Has patient been taught Hand Expression?: Yes Does the patient have breastfeeding experience prior to this delivery?: Yes  Feeding Feeding Type: Breast Fed Length of feed: 10 min  LATCH Score/Interventions                      Lactation Tools Discussed/Used WIC Program: No   Consult Status Consult Status: Follow-up Date: 04/27/16 Follow-up type: In-patient    Rulon Eisenmenger 04/26/2016, 10:21 PM

## 2016-04-26 NOTE — Anesthesia Pain Management Evaluation Note (Signed)
  CRNA Pain Management Visit Note  Patient: Amber Woodard, 35 y.o., female  "Hello I am a member of the anesthesia team at [facility]. We have an anesthesia team available at all times to provide care throughout the hospital, including epidural management and anesthesia for C-section. I don't know your plan for the delivery whether it a natural birth, water birth, IV sedation, nitrous supplementation, doula or epidural, but we want to meet your pain goals."   1.Was your pain managed to your expectations on prior hospitalizations?   Yes   2.What is your expectation for pain management during this hospitalization?     Epidural and IV pain meds  3.How can we help you reach that goal? Placing Epidural when labor intensifies.  Record the patient's initial score and the patient's pain goal.   Pain: 2  Pain Goal: 8 [facility] wants you to be able to say your pain was always managed very well.  Gregory Dowe 04/26/2016

## 2016-04-26 NOTE — Progress Notes (Signed)
YOSHI VICENCIO is a 35 y.o. G2P1001 at [redacted]w[redacted]d   Subjective: Pt with c/o lower back pain with contractions.  Plans to get epidural prn.  Objective: BP 117/61   Pulse 80   Temp 98 F (36.7 C) (Axillary)   Resp 18   Ht  (1.702 m)   Wt 82.1 kg (181 lb)   LMP 07/14/2015   BMI 28.35 kg/m  No intake/output data recorded. No intake/output data recorded.  FHT:  FHR: 130s bpm, variability: moderate,  accelerations:  Present,  decelerations:  Present late, variable-resolved with repositioning UC:   regular, every 2-4  minutes SVE:   Dilation: 3 Effacement (%): 50 Station: -3 Exam by:: Dr. Idamae Schuller  Labs: Lab Results  Component Value Date   WBC 11.5 (H) 04/26/2016   HGB 10.2 (L) 04/26/2016   HCT 31.5 (L) 04/26/2016   MCV 75.0 (L) 04/26/2016   PLT 215 04/26/2016    Assessment / Plan: Induction of labor due to postterm,  progressing well on pitocin  Labor: Progressing on Pitocin, will continue to increase then AROM Preeclampsia:  normal BP Fetal Wellbeing:  Category II Pain Control:  Nitrous oxide, IV Fentanyl or Epidural upon request.  D/w pt. I/D:  n/a Anticipated MOD:  NSVD  Eilam Shrewsbury 04/26/2016, 8:37 AM

## 2016-04-26 NOTE — Anesthesia Preprocedure Evaluation (Signed)
Anesthesia Evaluation  Patient identified by MRN, date of birth, ID band Patient awake    Reviewed: Allergy & Precautions, H&P , NPO status , Patient's Chart, lab work & pertinent test results  Airway Mallampati: II  TM Distance: >3 FB Neck ROM: full    Dental no notable dental hx.    Pulmonary neg pulmonary ROS,    Pulmonary exam normal breath sounds clear to auscultation       Cardiovascular negative cardio ROS Normal cardiovascular exam Rhythm:regular Rate:Normal     Neuro/Psych negative neurological ROS  negative psych ROS   GI/Hepatic negative GI ROS, Neg liver ROS,   Endo/Other  negative endocrine ROS  Renal/GU negative Renal ROS  negative genitourinary   Musculoskeletal   Abdominal   Peds  Hematology negative hematology ROS (+)   Anesthesia Other Findings Pregnancy - uncomplicated Platelets and allergies reviewed Denies active cardiac or pulmonary symptoms, METS > 4  Denies blood thinning medications, bleeding disorders, hypertension, asthma, supine hypotension syndrome, previous anesthesia difficulties   Reproductive/Obstetrics (+) Pregnancy                             Anesthesia Physical Anesthesia Plan  ASA: II  Anesthesia Plan: Epidural   Post-op Pain Management:    Induction:   Airway Management Planned:   Additional Equipment:   Intra-op Plan:   Post-operative Plan:   Informed Consent: I have reviewed the patients History and Physical, chart, labs and discussed the procedure including the risks, benefits and alternatives for the proposed anesthesia with the patient or authorized representative who has indicated his/her understanding and acceptance.     Plan Discussed with:   Anesthesia Plan Comments:         Anesthesia Quick Evaluation  

## 2016-04-26 NOTE — Anesthesia Procedure Notes (Signed)
Epidural Patient location during procedure: OB  Staffing Anesthesiologist: Zadiel Leyh Performed: anesthesiologist   Preanesthetic Checklist Completed: patient identified, site marked, surgical consent, pre-op evaluation, timeout performed, IV checked, risks and benefits discussed and monitors and equipment checked  Epidural Patient position: sitting Prep: site prepped and draped and DuraPrep Patient monitoring: continuous pulse ox and blood pressure Approach: midline Location: L3-L4 Injection technique: LOR saline  Needle:  Needle type: Tuohy  Needle gauge: 17 G Needle length: 9 cm and 9 Needle insertion depth: 6.5 cm Catheter type: closed end flexible Catheter size: 19 Gauge Catheter at skin depth: 11 cm Test dose: negative  Assessment Sensory level: T8 Events: blood not aspirated, injection not painful, no injection resistance, negative IV test and no paresthesia  Additional Notes Patient identified. Risks/Benefits/Options discussed with patient including but not limited to bleeding, infection, nerve damage, paralysis, failed block, incomplete pain control, headache, blood pressure changes, nausea, vomiting, reactions to medications, itching and postpartum back pain. Confirmed with bedside nurse the patient's most recent platelet count. Confirmed with patient that they are not currently taking any anticoagulation, have any bleeding history or any family history of bleeding disorders. Patient expressed understanding and wished to proceed. All questions were answered. Sterile technique was used throughout the entire procedure. Please see nursing notes for vital signs. Test dose was given through epidural catheter and negative prior to continuing to dose epidural or start infusion. Warning signs of high block given to the patient including shortness of breath, tingling/numbness in hands, complete motor block, or any concerning symptoms with instructions to call for help. Patient  was given instructions on fall risk and not to get out of bed. All questions and concerns addressed with instructions to call with any issues or inadequate analgesia.

## 2016-04-27 ENCOUNTER — Encounter (HOSPITAL_COMMUNITY): Payer: Self-pay | Admitting: *Deleted

## 2016-04-27 LAB — CBC
HCT: 28.1 % — ABNORMAL LOW (ref 36.0–46.0)
Hemoglobin: 9 g/dL — ABNORMAL LOW (ref 12.0–15.0)
MCH: 24.2 pg — ABNORMAL LOW (ref 26.0–34.0)
MCHC: 32 g/dL (ref 30.0–36.0)
MCV: 75.5 fL — AB (ref 78.0–100.0)
PLATELETS: 180 10*3/uL (ref 150–400)
RBC: 3.72 MIL/uL — ABNORMAL LOW (ref 3.87–5.11)
RDW: 14.3 % (ref 11.5–15.5)
WBC: 11.8 10*3/uL — AB (ref 4.0–10.5)

## 2016-04-27 MED ORDER — SERTRALINE HCL 50 MG PO TABS: 50.0000 mg | ORAL_TABLET | Freq: Every day | ORAL | 11 refills | Status: AC

## 2016-04-27 MED ORDER — SERTRALINE HCL 50 MG PO TABS
50.0000 mg | ORAL_TABLET | Freq: Every day | ORAL | Status: DC
  Administered 2016-04-27: 50 mg via ORAL
  Filled 2016-04-27 (×2): qty 1

## 2016-04-27 MED ORDER — IBUPROFEN 600 MG PO TABS: 600.0000 mg | ORAL_TABLET | Freq: Four times a day (QID) | ORAL | 0 refills | Status: AC

## 2016-04-27 MED ORDER — RHO D IMMUNE GLOBULIN 1500 UNIT/2ML IJ SOSY
300.0000 ug | PREFILLED_SYRINGE | Freq: Once | INTRAMUSCULAR | Status: AC
  Administered 2016-04-27: 300 ug via INTRAVENOUS
  Filled 2016-04-27: qty 2

## 2016-04-27 NOTE — Lactation Note (Signed)
This note was copied from a baby's chart. Lactation Consultation Note  Mom reported that baby fed well.  Patient Name: Amber Woodard [id number] Date: 04/27/2016 Reason for consult: Follow-up assessment   Maternal Data    Feeding Feeding Type: Breast Fed Length of feed: 0 min  LATCH Score/Interventions Latch: Too sleepy or reluctant, no latch achieved, no sucking elicited. (baby not hungry)  Audible Swallowing: None  Type of Nipple: Everted at rest and after stimulation  Comfort (Breast/Nipple): Soft / non-tender     Hold (Positioning): No assistance needed to correctly position infant at breast.  LATCH Score: 6  Lactation Tools Discussed/Used     Consult Status Consult Status: Complete    Soyla Dryer 04/27/2016, 12:41 PM

## 2016-04-27 NOTE — Discharge Instructions (Signed)

## 2016-04-27 NOTE — Anesthesia Postprocedure Evaluation (Signed)
Anesthesia Post Note  Patient: Amber Woodard  Procedure(s) Performed: * No procedures listed *  Patient location during evaluation: Mother Baby Anesthesia Type: Epidural Level of consciousness: awake and alert Pain management: pain level controlled Vital Signs Assessment: post-procedure vital signs reviewed and stable Respiratory status: spontaneous breathing, nonlabored ventilation and respiratory function stable Cardiovascular status: stable Postop Assessment: no headache, no backache and epidural receding Anesthetic complications: no     Last Vitals:  Vitals:   04/26/16 2130 04/27/16 0541  BP: 110/66 (!) 124/52  Pulse: 97 85  Resp: 18 18  Temp: 37 C 36.4 C    Last Pain:  Vitals:   04/27/16 0620  TempSrc:   PainSc: 2    Pain Goal: Patients Stated Pain Goal: 0 (04/27/16 0005)               Junious Silk

## 2016-04-27 NOTE — Progress Notes (Signed)
Postpartum Note Day # 1  S:  Patient resting comfortable in bed.  Pain controlled.  Tolerating general. + flatus, + BM.  Lochia appropriate.  Ambulating without difficulty.  She denies n/v/f/c, SOB, or CP.  Pt plans on breastfeeding.  O: Temp:  [97.3 F (36.3 C)-99 F (37.2 C)] 97.6 F (36.4 C) (07/27 0541) Pulse Rate:  [82-116] 85 (07/27 0541) Resp:  [16-20] 18 (07/27 0541) BP: (110-144)/(51-69) 124/52 (07/27 0541) SpO2:  [98 %-99 %] 98 % (07/26 2130) Gen: A&Ox3, NAD CV: Regular rate Resp: Normal respiratory effort Abdomen: soft, NT, ND Uterus: firm, non-tender, below umbilicus Ext: No edema, no calf tenderness bilaterally  Labs:  Recent Labs  04/26/16 0225 04/27/16 0547  HGB 10.2* 9.0*    A/P: Pt is a 35 y.o. [id number] s/p NSVD, PPD#1  - Pain well controlled -GU: UOP is adequate -GI: Tolerating general diet -Activity: encouraged sitting up to chair and ambulation as tolerated -Prophylaxis: early ambulation -Labs: stable as above -Anxiety- pt to continue with zoloft daily  Meeting postpartum milestones appropriately, plan for follow up in 2 weeks with Dr. Berton Lan, DO [phone] (pager) [phone] (office)

## 2016-04-27 NOTE — Lactation Note (Signed)
This note was copied from a baby's chart. Lactation Consultation Note  Mom desires reassurance regarding latch. Reviewed proper alignment and deep latch.  Baby was not hungry at this attempt so mom will call when baby latches.  Patient Name: Girl Tashe Purdon [id number] Date: 04/27/2016 Reason for consult: Follow-up assessment   Maternal Data    Feeding Feeding Type: Breast Fed Length of feed: 5 min  LATCH Score/Interventions Latch: Too sleepy or reluctant, no latch achieved, no sucking elicited. (baby not hungry)  Audible Swallowing: None  Type of Nipple: Everted at rest and after stimulation  Comfort (Breast/Nipple): Soft / non-tender     Hold (Positioning): No assistance needed to correctly position infant at breast.  LATCH Score: 6  Lactation Tools Discussed/Used     Consult Status Consult Status: Complete    Soyla Dryer 04/27/2016, 12:13 PM

## 2016-04-28 LAB — RH IG WORKUP (INCLUDES ABO/RH)
ABO/RH(D): A NEG
FETAL SCREEN: NEGATIVE
GESTATIONAL AGE(WKS): 38
Unit division: 0

## 2016-04-28 NOTE — Discharge Summary (Signed)
OB Discharge Summary     Patient Name: BLAKLEE SHORES DOB: 05/10/1981 MRN: [id number]  Date of admission: 04/26/2016 Delivering MD: Geryl Rankins   Date of discharge: 04/27/2016  Admitting diagnosis: INDUCTION Intrauterine pregnancy: [redacted]w[redacted]d     Secondary diagnosis:  Principal Problem:   Post term pregnancy over 40 weeks  Additional problems: none     Discharge diagnosis: Term Pregnancy Delivered                                                                                                Post partum procedures:none  Augmentation: Pitocin  Complications: None  Hospital course:  Induction of Labor With Vaginal Delivery   35 y.o. yo G2P1001 at [redacted]w[redacted]d was admitted to the hospital 04/26/2016 for induction of labor.  Indication for induction: Postdates.  Patient had an uncomplicated labor course as follows: Membrane Rupture Time/Date: 12:35 PM ,04/26/2016   Intrapartum Procedures: Episiotomy: None [1]                                         Lacerations:  1st degree [2];Labial [10]  Patient had delivery of a Viable infant.  Information for the patient's newborn:  Avanni, Turnbaugh [[id number]]      04/26/2016  Details of delivery can be found in separate delivery note.  Patient had a routine postpartum course. Patient is discharged home 04/28/16.   Physical exam Vitals:   04/26/16 1613 04/26/16 1709 04/26/16 2130 04/27/16 0541  BP: 125/69 114/64 110/66 (!) 124/52  Pulse: 87 88 97 85  Resp: 18 18 18 18   Temp: 99 F (37.2 C) 97.3 F (36.3 C) 98.6 F (37 C) 97.6 F (36.4 C)  TempSrc: Oral Oral Oral   SpO2: 99% 99% 98%   Weight:      Height:       General: alert, cooperative and no distress Lochia: appropriate Uterine Fundus: firm Incision: N/A DVT Evaluation: No evidence of DVT seen on physical exam. Labs: Lab Results  Component Value Date   WBC 11.8 (H) 04/27/2016   HGB 9.0 (L) 04/27/2016   HCT 28.1 (L) 04/27/2016   MCV 75.5 (L) 04/27/2016   PLT 180  04/27/2016   No flowsheet data found.  Discharge instruction: per After Visit Summary and "Baby and Me Booklet".  After visit meds:    Medication List    TAKE these medications   ibuprofen 600 MG tablet Commonly known as:  ADVIL,MOTRIN Take 1 tablet (600 mg total) by mouth every 6 (six) hours.   PRENATAL VITAMIN PO Take 2 Doses by mouth daily.   sertraline 50 MG tablet Commonly known as:  ZOLOFT Take 1 tablet (50 mg total) by mouth daily.       Diet: routine diet  Activity: Advance as tolerated. Pelvic rest for 6 weeks.   Outpatient follow up:2 weeks Follow up Appt:No future appointments. Follow up Visit:No Follow-up on file.  Postpartum contraception: Undecided  Newborn Data: Live born female  Birth Weight: 8 lb 3.2  oz (3719 g) APGAR: 9, 9  Baby Feeding: Breast Disposition:home with mother   04/28/2016 Myna Hidalgo, M, DO

## 2016-10-10 ENCOUNTER — Other Ambulatory Visit: Payer: Self-pay | Admitting: Internal Medicine

## 2016-10-10 DIAGNOSIS — R748 Abnormal levels of other serum enzymes: Secondary | ICD-10-CM

## 2016-10-18 ENCOUNTER — Other Ambulatory Visit: Payer: Federal, State, Local not specified - PPO

## 2016-10-24 ENCOUNTER — Ambulatory Visit
Admission: RE | Admit: 2016-10-24 | Discharge: 2016-10-24 | Disposition: A | Discharge status: Home / Self Care | Payer: Federal, State, Local not specified - PPO | Source: Ambulatory Visit | Attending: Internal Medicine | Admitting: Internal Medicine

## 2016-10-24 DIAGNOSIS — R748 Abnormal levels of other serum enzymes: Secondary | ICD-10-CM

## 2017-06-09 IMAGING — US US ABDOMEN COMPLETE
1 series · 14 of 25 positions shown · non-contrast
Comparison: None.

CLINICAL DATA: Elevated alkaline phosphatase

EXAM:
ABDOMEN ULTRASOUND COMPLETE

[Series 1: us abdomen complete · 0.19mm/px · 14 of 84 slices shown]
[im 1/84]
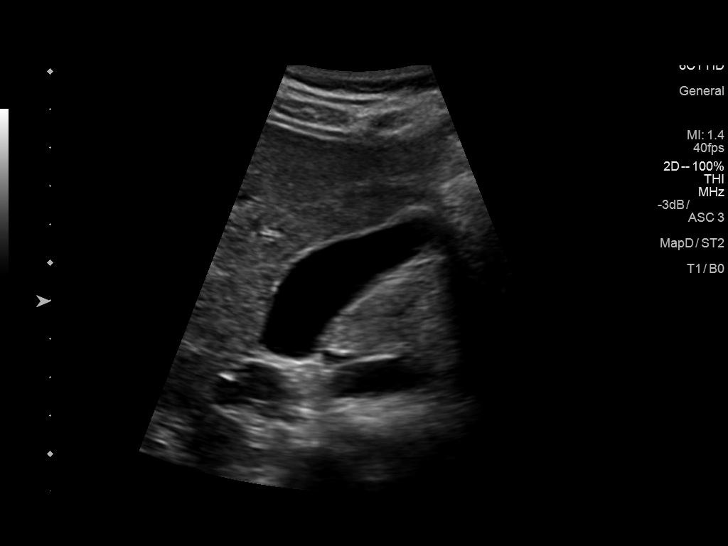
[im 7/84]
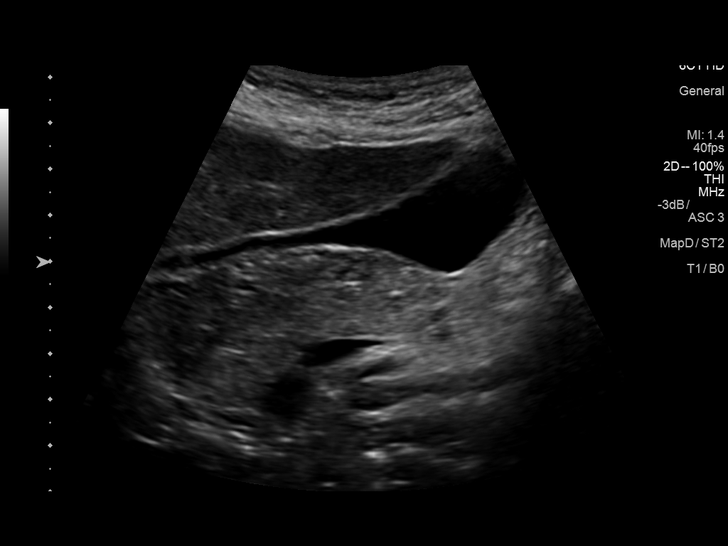
[im 14/84]
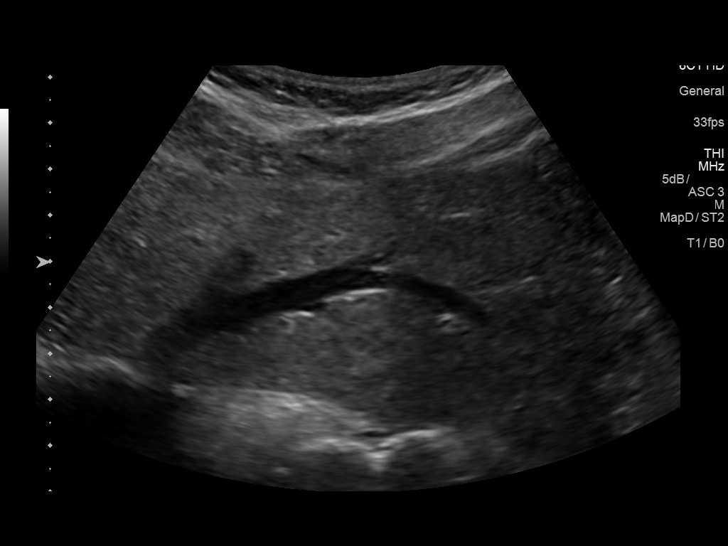
[im 21/84]
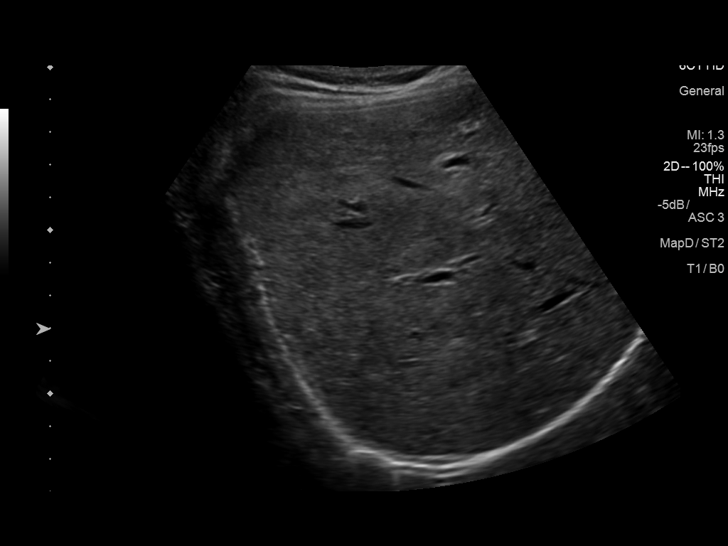
[im 28/84]
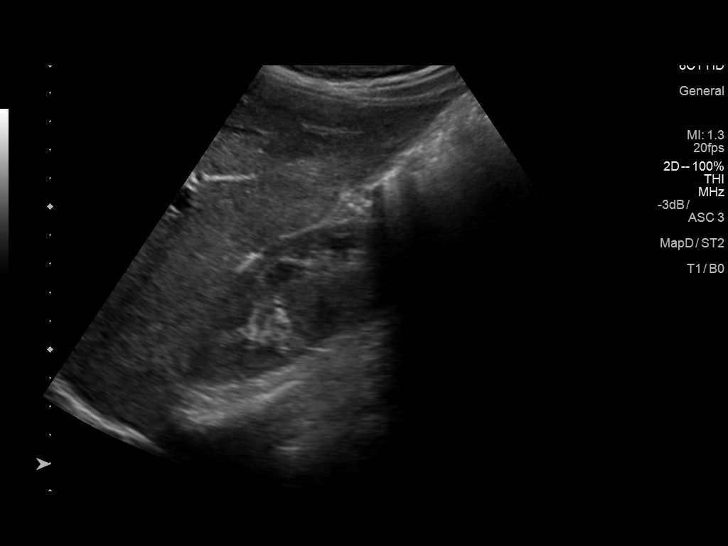
[im 32/84]
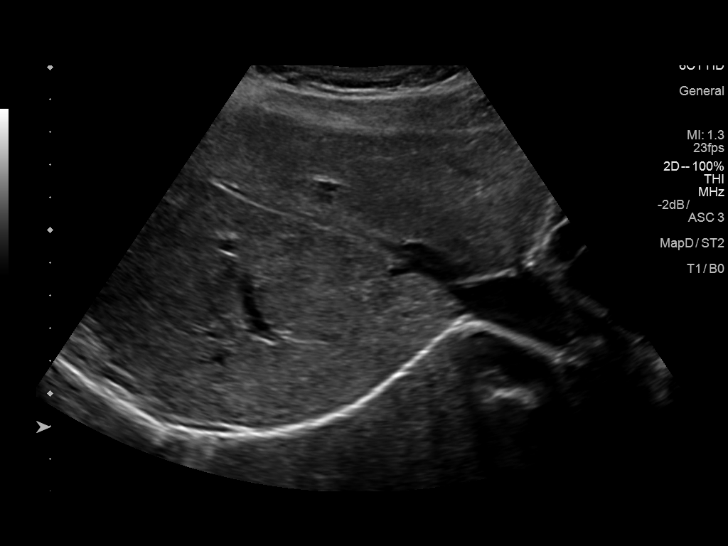
[im 39/84]
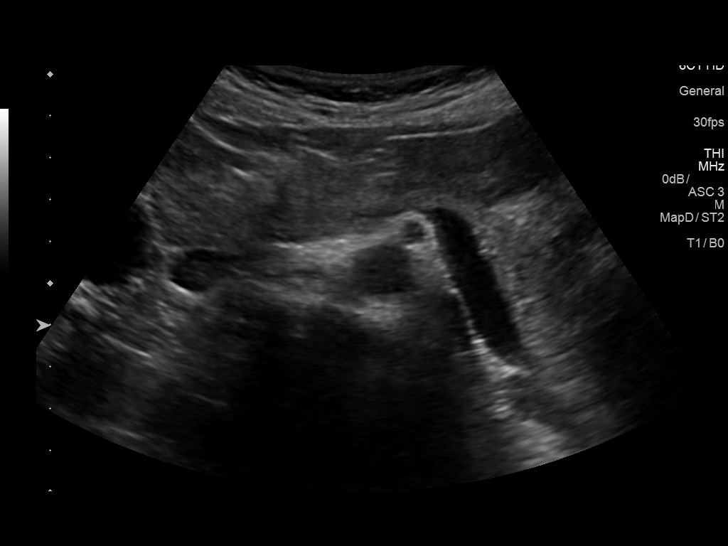
[im 45/84]
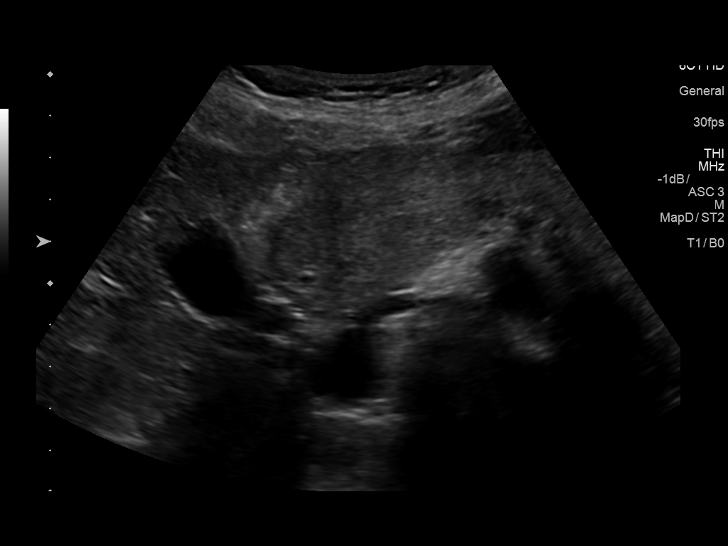
[im 52/84]
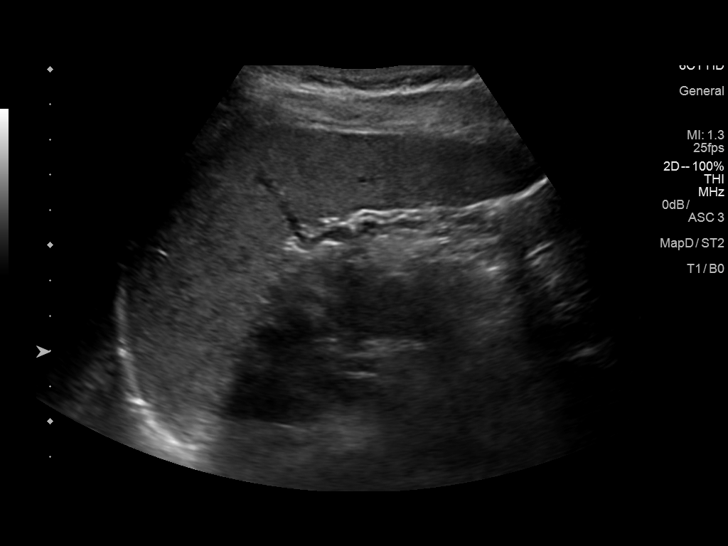
[im 56/84]
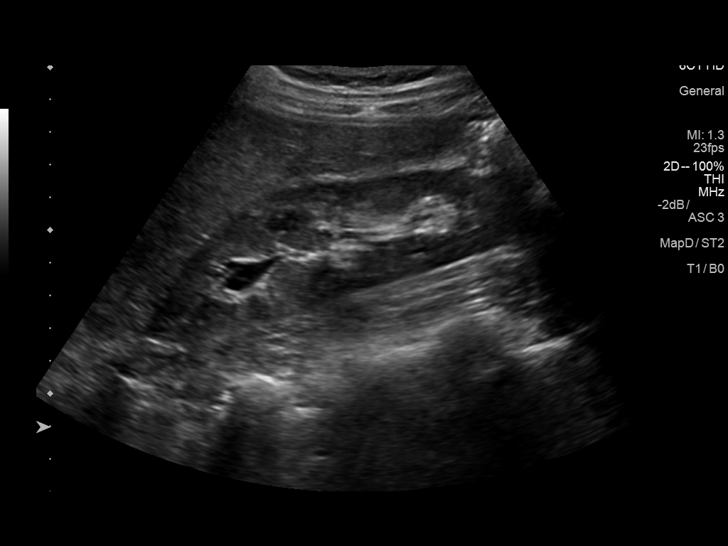
[im 63/84]
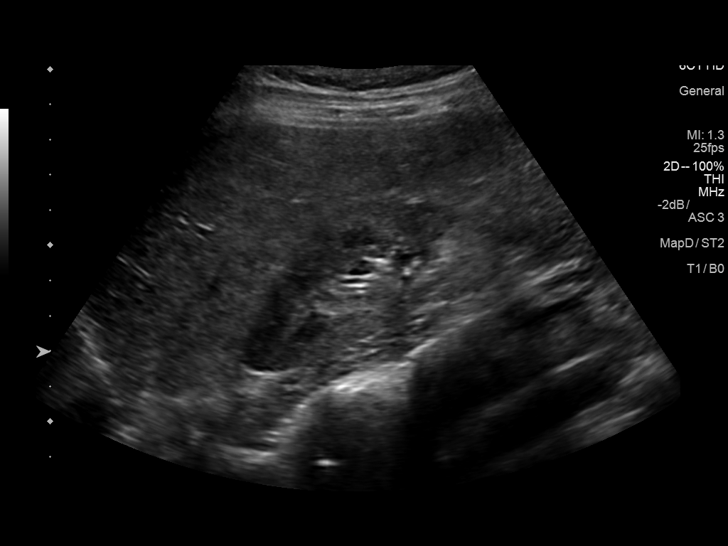
[im 70/84]
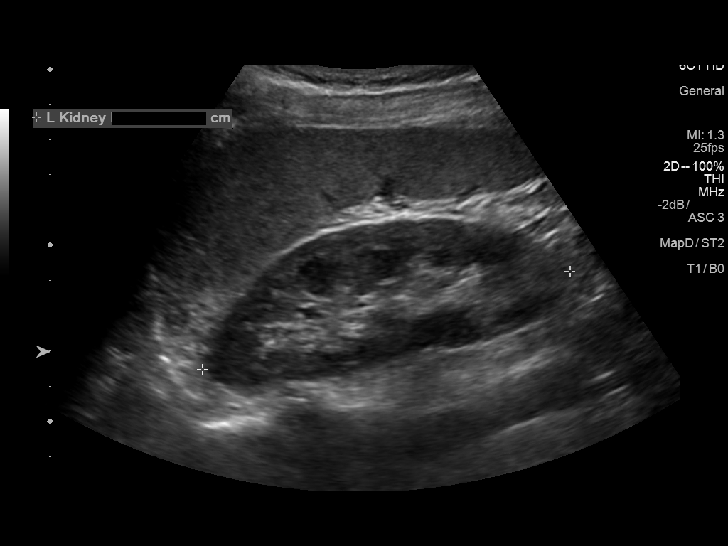
[im 77/84]
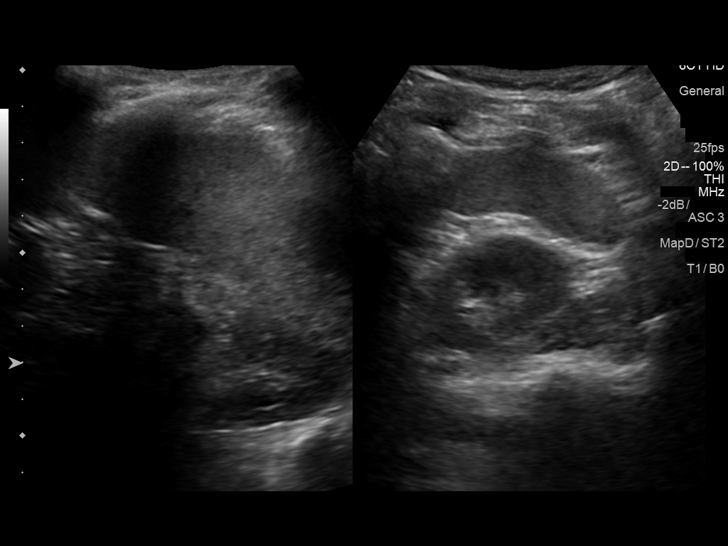
[im 84/84]
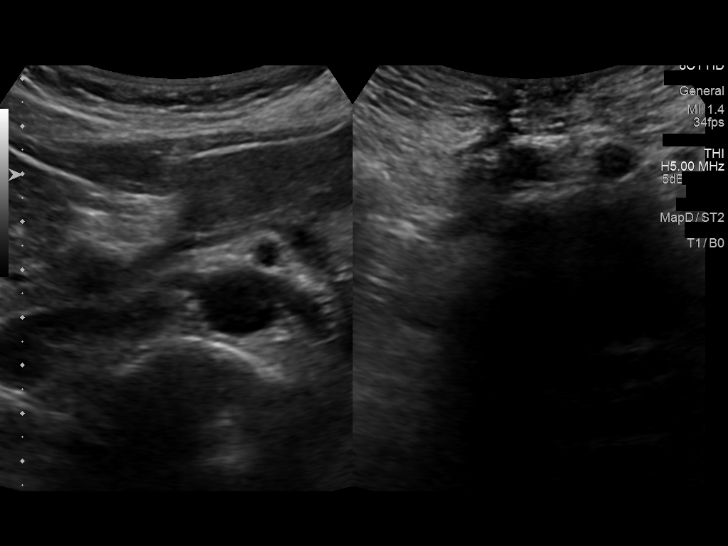

[14 of 25 positions shown; findings below may reference images not displayed]

FINDINGS: Gallbladder: No gallstones or wall thickening visualized. There is
no pericholecystic fluid. No sonographic Murphy sign noted by
sonographer.

Common bile duct: Diameter: 2 mm. No intrahepatic, common hepatic,
or common bile duct dilatation.

Liver: No focal lesion identified. Within normal limits in
parenchymal echogenicity.

IVC: No abnormality visualized.

Pancreas: No mass or inflammatory focus.

Spleen: Size and appearance within normal limits.

Right Kidney: Length: 12.0 cm. Echogenicity within normal limits. No
mass or hydronephrosis visualized.

Left Kidney: Length: 10.8 cm. Echogenicity within normal limits. No
mass or hydronephrosis visualized.

Abdominal aorta: No aneurysm visualized.

Other findings: No demonstrable ascites.
IMPRESSION: Study within normal limits.

## 2017-06-19 ENCOUNTER — Other Ambulatory Visit: Payer: Self-pay | Admitting: Obstetrics and Gynecology

## 2017-06-19 ENCOUNTER — Other Ambulatory Visit (HOSPITAL_COMMUNITY)
Admission: RE | Admit: 2017-06-19 | Discharge: 2017-06-19 | Disposition: A | Discharge status: Home / Self Care | Payer: Federal, State, Local not specified - PPO | Source: Ambulatory Visit | Attending: Obstetrics and Gynecology | Admitting: Obstetrics and Gynecology

## 2017-06-19 DIAGNOSIS — Z01419 Encounter for gynecological examination (general) (routine) without abnormal findings: Secondary | ICD-10-CM | POA: Diagnosis present

## 2017-06-21 LAB — CYTOLOGY - PAP: DIAGNOSIS: NEGATIVE
